# Patient Record
Sex: Female | Born: 1985 | Hispanic: No | Marital: Married | Smoking: Never smoker
Health system: Southern US, Community
[De-identification: ages and names within clinical notes are randomized; demographics above are authoritative.]

---

## 2018-04-21 ENCOUNTER — Encounter: Payer: Self-pay | Admitting: *Deleted

## 2018-05-15 ENCOUNTER — Emergency Department (HOSPITAL_COMMUNITY)
Admission: EM | Admit: 2018-05-15 | Discharge: 2018-05-15 | Disposition: A | Discharge status: Home / Self Care | Payer: Medicaid Other | Attending: Emergency Medicine | Admitting: Emergency Medicine

## 2018-05-15 ENCOUNTER — Encounter (HOSPITAL_COMMUNITY): Payer: Self-pay | Admitting: *Deleted

## 2018-05-15 ENCOUNTER — Emergency Department (HOSPITAL_COMMUNITY): Payer: Medicaid Other

## 2018-05-15 ENCOUNTER — Other Ambulatory Visit: Payer: Self-pay

## 2018-05-15 DIAGNOSIS — O039 Complete or unspecified spontaneous abortion without complication: Secondary | ICD-10-CM

## 2018-05-15 DIAGNOSIS — O034 Incomplete spontaneous abortion without complication: Secondary | ICD-10-CM | POA: Diagnosis not present

## 2018-05-15 DIAGNOSIS — Z79899 Other long term (current) drug therapy: Secondary | ICD-10-CM | POA: Insufficient documentation

## 2018-05-15 LAB — CBC WITH DIFFERENTIAL/PLATELET
Abs Immature Granulocytes: 0.1 10*3/uL (ref 0.0–0.1)
BASOS ABS: 0 10*3/uL (ref 0.0–0.1)
BASOS PCT: 0 %
EOS ABS: 0 10*3/uL (ref 0.0–0.7)
EOS PCT: 0 %
HCT: 40.6 % (ref 36.0–46.0)
HEMOGLOBIN: 12.9 g/dL (ref 12.0–15.0)
Immature Granulocytes: 1 %
LYMPHS PCT: 7 %
Lymphs Abs: 1 10*3/uL (ref 0.7–4.0)
MCH: 24.9 pg — ABNORMAL LOW (ref 26.0–34.0)
MCHC: 31.8 g/dL (ref 30.0–36.0)
MCV: 78.4 fL (ref 78.0–100.0)
Monocytes Absolute: 0.6 10*3/uL (ref 0.1–1.0)
Monocytes Relative: 5 %
Neutro Abs: 12.5 10*3/uL — ABNORMAL HIGH (ref 1.7–7.7)
Neutrophils Relative %: 87 %
PLATELETS: 315 10*3/uL (ref 150–400)
RBC: 5.18 MIL/uL — AB (ref 3.87–5.11)
RDW: 15.6 % — AB (ref 11.5–15.5)
WBC: 14.2 10*3/uL — AB (ref 4.0–10.5)

## 2018-05-15 LAB — I-STAT BETA HCG BLOOD, ED (MC, WL, AP ONLY): I-stat hCG, quantitative: >2000 m[IU]/mL — ABNORMAL HIGH (ref <5)

## 2018-05-15 LAB — ABO/RH: ABO/RH(D): A POS

## 2018-05-15 MED ORDER — IBUPROFEN 800 MG PO TABS: 800.0000 mg | ORAL_TABLET | Freq: Three times a day (TID) | ORAL | 0 refills | Status: AC

## 2018-05-15 NOTE — Discharge Instructions (Signed)
I am so sorry about this today. Please keep your appointment with the ob/gyn on Monday 05/18/18. I have prescribed you medication for the pain.

## 2018-05-15 NOTE — ED Notes (Signed)
Patient transported to Ultrasound 

## 2018-05-15 NOTE — ED Provider Notes (Signed)
[facility] EMERGENCY DEPARTMENT Provider Note  CSN: [id number] Arrival date & time: 05/15/18  4098  History   Chief Complaint Chief Complaint  Patient presents with  . Vaginal Bleeding   HPI  A medical interpreter was used for this interview.  Susan Wood is a 32 y.o. G2P1 female with no significant medical history who presented to the ED for vaginal bleeding x5 hours. She describes it as "just a little bit." Associated symptoms: abdominal cramping. Denies fever, chills, N/V, changes in bowel or urinary habits. Patient states she is ~ [redacted] weeks pregnant and has an upcoming appointment with an ob/gyn on Monday 05/19/18 at [facility].  History reviewed. No pertinent past medical history.  There are no active problems to display for this patient.  History reviewed. No pertinent surgical history.   OB History    Gravida  1   Para      Term      Preterm      AB      Living        SAB      TAB      Ectopic      Multiple      Live Births               Home Medications    Prior to Admission medications   Medication Sig Start Date End Date Taking? Authorizing Provider  Prenatal Vit-Fe Fumarate-FA (PRENATAL MULTIVITAMIN) TABS tablet Take 1 tablet by mouth daily at 12 noon.   Yes [provider]  ibuprofen (ADVIL,MOTRIN) 800 MG tablet Take 1 tablet (800 mg total) by mouth 3 (three) times daily for 10 days. 05/15/18 05/25/18  Mortis, Sharyon Medicus, PA-C    Family History History reviewed. No pertinent family history.  Social History Social History   Tobacco Use  . Smoking status: Never Smoker  . Smokeless tobacco: Never Used  Substance Use Topics  . Alcohol use: Never    Frequency: Never  . Drug use: Never     Allergies   Patient has no known allergies.   Review of Systems Review of Systems  Constitutional: Negative for chills, diaphoresis and fever.  Gastrointestinal: Positive for abdominal pain. Negative for  constipation, diarrhea, nausea and vomiting.  Genitourinary: Positive for vaginal bleeding. Negative for difficulty urinating, dysuria and frequency.  Musculoskeletal: Negative.   Skin: Negative.      Physical Exam Updated Vital Signs BP 106/68   Pulse 96   Temp 97.9 F (36.6 C) (Oral)   Resp 16   Ht 4\' 11"  (1.499 m)   Wt 43 kg (94 lb 12.8 oz)   LMP 03/13/2018 (Exact Date)   SpO2 100%   BMI 19.15 kg/m   Physical Exam  Constitutional: She appears well-developed and well-nourished. No distress.  Cardiovascular: Normal rate, regular rhythm and normal heart sounds.  Pulmonary/Chest: Effort normal and breath sounds normal.  Abdominal: Soft. Bowel sounds are normal. There is tenderness in the periumbilical area and suprapubic area. Hernia confirmed negative in the right inguinal area and confirmed negative in the left inguinal area.  Genitourinary: Pelvic exam was performed with patient prone. There is no rash or lesion on the right labia. There is no rash or lesion on the left labia. There is bleeding in the vagina.  Genitourinary Comments: Cervical os open. Dark red blood coming from cervix.  Skin: Skin is warm. Capillary refill takes less than 2 seconds. She is not diaphoretic. No pallor.  Nursing note and  vitals reviewed.    ED Treatments / Results  Labs (all labs ordered are listed, but only abnormal results are displayed) Labs Reviewed  CBC WITH DIFFERENTIAL/PLATELET - Abnormal; Notable for the following components:      Result Value   WBC 14.2 (*)    RBC 5.18 (*)    MCH 24.9 (*)    RDW 15.6 (*)    Neutro Abs 12.5 (*)    All other components within normal limits  I-STAT BETA HCG BLOOD, ED ([facility], [facility], [facility] ONLY) - Abnormal; Notable for the following components:   I-stat hCG, quantitative >2,000.0 (*)    All other components within normal limits  ABO/RH    EKG None  Radiology US Ob Comp Less 14 Wks  Result Date: 05/15/2018 CLINICAL DATA:  Vaginal bleeding. Estimated  gestational age of [redacted] weeks, 0 days by LMP. EXAM: OBSTETRIC <14 WK [place] AND TRANSVAGINAL OB US TECHNIQUE: Both transabdominal and transvaginal ultrasound examinations were performed for complete evaluation of the gestation as well as the maternal uterus, adnexal regions, and pelvic cul-de-sac. Transvaginal technique was performed to assess early pregnancy. COMPARISON:  None. FINDINGS: Intrauterine gestational sac: Poorly defined area of fluid along the endometrial canal. No discrete gestational sac identified. Yolk sac:  Not Visualized. Embryo:  Not Visualized. Subchorionic hemorrhage:  None visualized. Maternal uterus/adnexae: Unremarkable. IMPRESSION: 1. Poorly defined area of fluid along the endometrial canal without discrete gestational sac identified. No embryo. Given estimated gestational age of [redacted] weeks 0 days by LMP, findings are suspicious but not yet definitive for failed pregnancy. Recommend follow-up US in 10-14 days for definitive diagnosis. This recommendation follows SRU consensus guidelines: Diagnostic Criteria for Nonviable Pregnancy Early in the First Trimester. Malva Limes Med 2013; 161:0960-45. Electronically Signed   By: Obie Dredge M.D.   On: 05/15/2018 14:13   US Ob Transvaginal  Result Date: 05/15/2018 CLINICAL DATA:  Vaginal bleeding. Estimated gestational age of [redacted] weeks, 0 days by LMP. EXAM: OBSTETRIC <14 WK [place] AND TRANSVAGINAL OB US TECHNIQUE: Both transabdominal and transvaginal ultrasound examinations were performed for complete evaluation of the gestation as well as the maternal uterus, adnexal regions, and pelvic cul-de-sac. Transvaginal technique was performed to assess early pregnancy. COMPARISON:  None. FINDINGS: Intrauterine gestational sac: Poorly defined area of fluid along the endometrial canal. No discrete gestational sac identified. Yolk sac:  Not Visualized. Embryo:  Not Visualized. Subchorionic hemorrhage:  None visualized. Maternal uterus/adnexae: Unremarkable. IMPRESSION:  1. Poorly defined area of fluid along the endometrial canal without discrete gestational sac identified. No embryo. Given estimated gestational age of [redacted] weeks 0 days by LMP, findings are suspicious but not yet definitive for failed pregnancy. Recommend follow-up US in 10-14 days for definitive diagnosis. This recommendation follows SRU consensus guidelines: Diagnostic Criteria for Nonviable Pregnancy Early in the First Trimester. Malva Limes Med 2013; 409:8119-14. Electronically Signed   By: Obie Dredge M.D.   On: 05/15/2018 14:13    Procedures Procedures (including critical care time)  Medications Ordered in ED Medications - No data to display   Initial Impression / Assessment and Plan / ED Course  Triage vital signs and the nursing notes have been reviewed.  Pertinent labs & imaging results that were available during care of the patient were reviewed and considered in medical decision making (see chart for details).  Patient presents afebrile with symptoms concerning of a miscarriage. She is well appearing and has normal vital signs throughout the visit (with the exception of increased HR  during the pelvic exam). An open cervical os with bleeding was seen on pelvic exam. No POC visualized. Spontaneous abortion/miscarriage was discussed with the patient. After these findings were discussed with patient, she insists on getting pelvic ultrasound.  Clinical Course as of May 15 1606  Thu May 15, 2018  1130 Hcg recorded as > 2000. No previous hCG levels to compare to, but patient states she is ~ [redacted] weeks pregnant.   [GM]  1425 No gestational sac seen on U/S.   [GM]  1603 Rh +. No RhoGam needed.   [GM]    Clinical Course User Index [GM] Mortis, Sharyon MedicusGabrielle I, PA-C   Ultrasound results revealed and thorough education provided on miscarriages and appropriate follow-up measures. No s/s of an acute infection or sepsis that requires additional evaluation or antibiotics today.  Final Clinical  Impressions(s) / ED Diagnoses  1. Inevitable Abortion. Thorough education provided. Advised patient to keep ob/gyn follow-up that is scheduled for Monday 05/19/18. Ibuprofen prescribed for pain.  Dispo: Home. After thorough clinical evaluation, this patient is determined to be medically stable and can be safely discharged with the previously mentioned treatment and/or outpatient follow-up/referral(s). At this time, there are no other apparent medical conditions that require further screening, evaluation or treatment.   Final diagnoses:  Inevitable abortion    ED Discharge Orders        Ordered    ibuprofen (ADVIL,MOTRIN) 800 MG tablet  3 times daily     05/15/18 [address]1605        Mortis, Charlotte Court HouseGabrielle I, [place] JerseyPA-C 05/15/18 1608    Wynetta FinesMessick, Peter C, MD 05/16/18 [phone]

## 2018-05-15 NOTE — ED Triage Notes (Signed)
PT reports to be 9 weeks preg. And has vag bleeding that started at 0400 . Pt reports bleeding to be moderate. Pt also reports ABD pain 5/10

## 2018-05-19 ENCOUNTER — Ambulatory Visit (INDEPENDENT_AMBULATORY_CARE_PROVIDER_SITE_OTHER): Payer: Medicaid Other | Admitting: Obstetrics and Gynecology

## 2018-05-19 DIAGNOSIS — O200 Threatened abortion: Secondary | ICD-10-CM

## 2018-05-19 NOTE — Progress Notes (Signed)
Patient not seen by MD. Patient seen on 7/11 in ED with light vaginal bleeding and diagnosed with threatened miscarriage. Patient is currently without pain Quant HCG today with plans to repeat in 48 hours  Follow up ultrasound also ordered Patient will be contacted with results and further management plan

## 2018-05-20 LAB — BETA HCG QUANT (REF LAB): HCG QUANT: 1757 m[IU]/mL

## 2018-05-21 ENCOUNTER — Other Ambulatory Visit: Payer: Medicaid Other

## 2018-05-21 DIAGNOSIS — O200 Threatened abortion: Secondary | ICD-10-CM

## 2018-05-22 ENCOUNTER — Telehealth: Payer: Self-pay

## 2018-05-22 LAB — BETA HCG QUANT (REF LAB): hCG Quant: 823 m[IU]/mL

## 2018-05-22 NOTE — Telephone Encounter (Signed)
Pt aware of results 

## 2018-05-26 ENCOUNTER — Ambulatory Visit (INDEPENDENT_AMBULATORY_CARE_PROVIDER_SITE_OTHER): Payer: Medicaid Other | Admitting: Obstetrics and Gynecology

## 2018-05-26 ENCOUNTER — Ambulatory Visit (HOSPITAL_COMMUNITY)
Admission: RE | Admit: 2018-05-26 | Discharge: 2018-05-26 | Disposition: A | Discharge status: Home / Self Care | Payer: Medicaid Other | Source: Ambulatory Visit | Attending: Obstetrics and Gynecology | Admitting: Obstetrics and Gynecology

## 2018-05-26 ENCOUNTER — Encounter: Payer: Self-pay | Admitting: Obstetrics and Gynecology

## 2018-05-26 VITALS — BP 121/85 | HR 90 | Wt 92.0 lb

## 2018-05-26 DIAGNOSIS — O200 Threatened abortion: Secondary | ICD-10-CM | POA: Diagnosis present

## 2018-05-26 DIAGNOSIS — O039 Complete or unspecified spontaneous abortion without complication: Secondary | ICD-10-CM

## 2018-05-26 DIAGNOSIS — Z3A00 Weeks of gestation of pregnancy not specified: Secondary | ICD-10-CM | POA: Insufficient documentation

## 2018-05-26 MED ORDER — PREPLUS 27-1 MG PO TABS: 1.0000 | ORAL_TABLET | Freq: Every day | ORAL | 13 refills | Status: AC

## 2018-05-26 NOTE — Progress Notes (Signed)
Ultrasounds Results Note  SUBJECTIVE HPI:  Ms. Susan Wood is a 32 y.o. G1P0 who presents for followup ultrasound results. The patient denies abdominal pain. She reports light vaginal bleeding. She reports some dysuria Upon review of the patient's records, patient was first seen in ED on 7/11 for vaginal bleeding and diagnosed with a threatened miscarriage.   BHCG on that day was greater than 2000 (istat). Repeat quant HCG on 7/15 was 1757.  Ultrasound showed an irregularly shaped fluid collection in endometrial canal, no yolk sac or fetal pole visualized.  BHCG on 7/17 was 823.  Repeat ultrasound was performed earlier today.   History reviewed. No pertinent past medical history. History reviewed. No pertinent surgical history. Social History   Socioeconomic History  . Marital status: Married    Spouse name: Not on file  . Number of children: Not on file  . Years of education: Not on file  . Highest education level: Not on file  Occupational History  . Not on file  Social Needs  . Financial resource strain: Not on file  . Food insecurity:    Worry: Not on file    Inability: Not on file  . Transportation needs:    Medical: Not on file    Non-medical: Not on file  Tobacco Use  . Smoking status: Never Smoker  . Smokeless tobacco: Never Used  Substance and Sexual Activity  . Alcohol use: Never    Frequency: Never  . Drug use: Never  . Sexual activity: Not on file  Lifestyle  . Physical activity:    Days per week: Not on file    Minutes per session: Not on file  . Stress: Not on file  Relationships  . Social connections:    Talks on phone: Not on file    Gets together: Not on file    Attends religious service: Not on file    Active member of club or organization: Not on file    Attends meetings of clubs or organizations: Not on file    Relationship status: Not on file  . Intimate partner violence:    Fear of current or ex partner: Not on file    Emotionally abused: Not  on file    Physically abused: Not on file    Forced sexual activity: Not on file  Other Topics Concern  . Not on file  Social History Narrative  . Not on file   Current Outpatient Medications on File Prior to Visit  Medication Sig Dispense Refill  . Prenatal Vit-Fe Fumarate-FA (PRENATAL MULTIVITAMIN) TABS tablet Take 1 tablet by mouth daily at 12 noon.     No current facility-administered medications on file prior to visit.    No Known Allergies  I have reviewed patient's Past Medical Hx, Surgical Hx, Family Hx, Social Hx, medications and allergies.   Review of Systems Review of Systems  Constitutional: Negative for fever and chills.  Gastrointestinal: Negative for nausea, vomiting, abdominal pain, diarrhea and constipation.  Genitourinary: Negative for dysuria.  Musculoskeletal: Negative for back pain.  Neurological: Negative for dizziness and weakness.    Physical Exam  LMP 03/13/2018 (Exact Date)   GENERAL: Well-developed, well-nourished female in no acute distress.  HEENT: Normocephalic, atraumatic.   LUNGS: Effort normal ABDOMEN: soft, non-tender HEART: Regular rate  SKIN: Warm, dry and without erythema PSYCH: Normal mood and affect NEURO: Alert and oriented x 4  LAB RESULTS No results found for this or any previous visit (from the past 24 hour(s)).  IMAGING US Ob Comp Less 14 Wks  Result Date: 05/15/2018 CLINICAL DATA:  Vaginal bleeding. Estimated gestational age of [redacted] weeks, 0 days by LMP. EXAM: OBSTETRIC <14 WK [place] AND TRANSVAGINAL OB US TECHNIQUE: Both transabdominal and transvaginal ultrasound examinations were performed for complete evaluation of the gestation as well as the maternal uterus, adnexal regions, and pelvic cul-de-sac. Transvaginal technique was performed to assess early pregnancy. COMPARISON:  None. FINDINGS: Intrauterine gestational sac: Poorly defined area of fluid along the endometrial canal. No discrete gestational sac identified. Yolk sac:  Not  Visualized. Embryo:  Not Visualized. Subchorionic hemorrhage:  None visualized. Maternal uterus/adnexae: Unremarkable. IMPRESSION: 1. Poorly defined area of fluid along the endometrial canal without discrete gestational sac identified. No embryo. Given estimated gestational age of [redacted] weeks 0 days by LMP, findings are suspicious but not yet definitive for failed pregnancy. Recommend follow-up US in 10-14 days for definitive diagnosis. This recommendation follows SRU consensus guidelines: Diagnostic Criteria for Nonviable Pregnancy Early in the First Trimester. Malva Limes Med 2013; 132:4401-02. Electronically Signed   By: Obie Dredge M.D.   On: 05/15/2018 14:13   US Ob Transvaginal  Result Date: 05/15/2018 CLINICAL DATA:  Vaginal bleeding. Estimated gestational age of [redacted] weeks, 0 days by LMP. EXAM: OBSTETRIC <14 WK [place] AND TRANSVAGINAL OB US TECHNIQUE: Both transabdominal and transvaginal ultrasound examinations were performed for complete evaluation of the gestation as well as the maternal uterus, adnexal regions, and pelvic cul-de-sac. Transvaginal technique was performed to assess early pregnancy. COMPARISON:  None. FINDINGS: Intrauterine gestational sac: Poorly defined area of fluid along the endometrial canal. No discrete gestational sac identified. Yolk sac:  Not Visualized. Embryo:  Not Visualized. Subchorionic hemorrhage:  None visualized. Maternal uterus/adnexae: Unremarkable. IMPRESSION: 1. Poorly defined area of fluid along the endometrial canal without discrete gestational sac identified. No embryo. Given estimated gestational age of [redacted] weeks 0 days by LMP, findings are suspicious but not yet definitive for failed pregnancy. Recommend follow-up US in 10-14 days for definitive diagnosis. This recommendation follows SRU consensus guidelines: Diagnostic Criteria for Nonviable Pregnancy Early in the First Trimester. Malva Limes Med 2013; 725:3664-40. Electronically Signed   By: Obie Dredge M.D.   On:  05/15/2018 14:13   US Ob Less Than 14 Weeks With Ob Transvaginal  Result Date: 05/26/2018 CLINICAL DATA:  Evaluate for pregnancy. EXAM: OBSTETRIC <14 WK [place] AND TRANSVAGINAL OB US TECHNIQUE: Both transabdominal and transvaginal ultrasound examinations were performed for complete evaluation of the gestation as well as the maternal uterus, adnexal regions, and pelvic cul-de-sac. Transvaginal technique was performed to assess early pregnancy. COMPARISON:  Pelvic ultrasound 05/15/2018 FINDINGS: Intrauterine gestational sac: None Yolk sac:  Not Visualized. Embryo:  Not Visualized. Cardiac Activity: Not Visualized. Heart Rate: 0 bpm Maternal uterus/adnexae: No intrauterine gestation identified. Normal right and left ovary. No endometrial mass or fluid collection. No free fluid in the pelvis. IMPRESSION: No intrauterine gestation identified. In the setting of positive pregnancy test and no definite intrauterine pregnancy, this reflects a pregnancy of unknown location. Differential considerations include early normal IUP, abnormal IUP, or nonvisualized ectopic pregnancy. Differentiation is achieved with serial beta HCG supplemented by repeat sonography as clinically warranted. Electronically Signed   By: Annia Belt M.D.   On: 05/26/2018 10:55    ASSESSMENT 1. Spontaneous abortion in first trimester     PLAN Discharge home in stable condition Repeat quant HCG today Urine culture today Advised patient that levels need to be followed until negative Patient plans to  conceive again. Advised patient to wait 3 normal cycles Continue the use of prenatal vitamins  Deon Duer, MD  05/26/2018  2:56 PM

## 2018-05-26 NOTE — Progress Notes (Signed)
Pt states that she has a "pinching" like pain in abdomen.  Still having some bleeding, light to no bleeding.

## 2018-05-27 ENCOUNTER — Other Ambulatory Visit: Payer: Self-pay | Admitting: Obstetrics and Gynecology

## 2018-05-27 DIAGNOSIS — O200 Threatened abortion: Secondary | ICD-10-CM

## 2018-05-27 LAB — BETA HCG QUANT (REF LAB): hCG Quant: 167 m[IU]/mL

## 2018-05-28 LAB — URINE CULTURE

## 2018-06-02 ENCOUNTER — Other Ambulatory Visit: Payer: Medicaid Other

## 2018-06-02 DIAGNOSIS — O039 Complete or unspecified spontaneous abortion without complication: Secondary | ICD-10-CM

## 2018-06-03 LAB — BETA HCG QUANT (REF LAB): hCG Quant: 23 m[IU]/mL

## 2018-06-09 ENCOUNTER — Other Ambulatory Visit: Payer: Medicaid Other

## 2018-06-09 DIAGNOSIS — O039 Complete or unspecified spontaneous abortion without complication: Secondary | ICD-10-CM

## 2018-06-10 LAB — HCG, SERUM, QUALITATIVE: HCG, BETA SUBUNIT, QUAL, SERUM: NEGATIVE m[IU]/mL (ref <6)

## 2018-08-26 ENCOUNTER — Ambulatory Visit (HOSPITAL_COMMUNITY)
Admission: RE | Admit: 2018-08-26 | Discharge: 2018-08-26 | Disposition: A | Discharge status: Home / Self Care | Payer: Medicaid Other | Source: Ambulatory Visit | Attending: Obstetrics and Gynecology | Admitting: Obstetrics and Gynecology

## 2018-08-26 ENCOUNTER — Ambulatory Visit (INDEPENDENT_AMBULATORY_CARE_PROVIDER_SITE_OTHER): Payer: Medicaid Other | Admitting: Obstetrics and Gynecology

## 2018-08-26 ENCOUNTER — Encounter: Payer: Self-pay | Admitting: Obstetrics and Gynecology

## 2018-08-26 VITALS — BP 115/78 | HR 101 | Wt 96.4 lb

## 2018-08-26 DIAGNOSIS — N939 Abnormal uterine and vaginal bleeding, unspecified: Secondary | ICD-10-CM

## 2018-08-26 LAB — POCT URINE PREGNANCY: Preg Test, Ur: NEGATIVE

## 2018-08-26 NOTE — Progress Notes (Signed)
Pt is here for abnormal menstrual cycles. Pt had a miscarriage in 7/19. HCG was followed to 0. Pt states that 09/30 had menstrual bleeding and then 10/12 again had a cycle. Pt states she is concerned about the irregular bleeding.

## 2018-08-26 NOTE — Progress Notes (Signed)
32 yo G2P1010 here for evaluation of irregular menses. Patient had a miscarriage in July and reports a regular cycle since until most recently in October. She describes a monthly 28 days cycle with vaginal bleeding for 3-4 days. However, in October, she experienced a period 2 weeks after her September period which lasted more than 4 days. The bleeding is now reduced to vaginal spotting. Patient also reports some occasional lower abdominal pelvic pain. She is without any other concerns. She denies any abnormal vaginal bleeding or discharge. Patient denies any external stressors, excessive exercise or change in her diet.  No past medical history on file. Past Surgical History:  Procedure Laterality Date  . CESAREAN SECTION     No family history on file. Social History   Tobacco Use  . Smoking status: Never Smoker  . Smokeless tobacco: Never Used  Substance Use Topics  . Alcohol use: Never    Frequency: Never  . Drug use: Never   ROS See pertinent in HPI  Blood pressure 115/78, pulse (!) 101, weight 96 lb 6.4 oz (43.7 kg), last menstrual period 08/16/2018. GENERAL: Well-developed, well-nourished female in no acute distress.  ABDOMEN: Soft, nontender, nondistended. No organomegaly. PELVIC: Normal external female genitalia. Vagina is pink and rugated.  Brown discharge. Normal appearing cervix. Uterus is normal in size. No adnexal mass or tenderness. EXTREMITIES: No cyanosis, clubbing, or edema, 2+ distal pulses.  A/P 32 yo with irregular cycle x 1 month  - Reassurance provided  - Advised to keep a menstrual calendar and to return if irregular bleeding pattern persists - Pelvic ultrasound ordered - Patient desires to conceive- advised to continue prenatal vitamins - Patient will be contacted with results of ultrasound and further management if indicated - RTC prn

## 2018-08-27 ENCOUNTER — Telehealth: Payer: Self-pay

## 2018-08-27 NOTE — Telephone Encounter (Signed)
Contacted pt via interpreter and advised of results.

## 2019-03-02 IMAGING — US US OB COMP LESS 14 WK
1 series · 15 of 28 positions shown · non-contrast
Comparison: Pelvic ultrasound 05/15/2018

ADDENDUM:
Addendum for correction of

EXAM:
No transvaginal ultrasound was performed during the exam. This was
inadvertently dictated during reading of the exam. Only
transabdominal images were obtained.
CLINICAL DATA: Evaluate for pregnancy.
OBSTETRIC <14 WK US AND TRANSVAGINAL OB US
TECHNIQUE: Both transabdominal and transvaginal ultrasound examinations were
performed for complete evaluation of the gestation as well as the
maternal uterus, adnexal regions, and pelvic cul-de-sac.
Transvaginal technique was performed to assess early pregnancy.

[Series 1: us ob comp less 14 wk · 36 acquisitions, 15 frames shown]
[im 1/36]
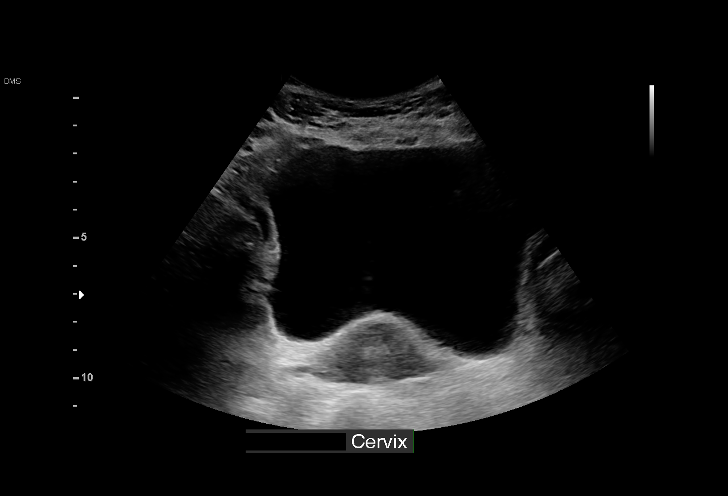
[im 3/36]
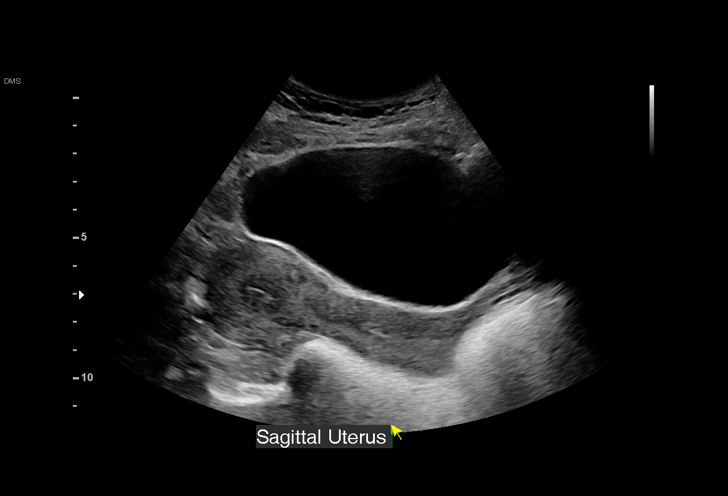
[im 6/36]
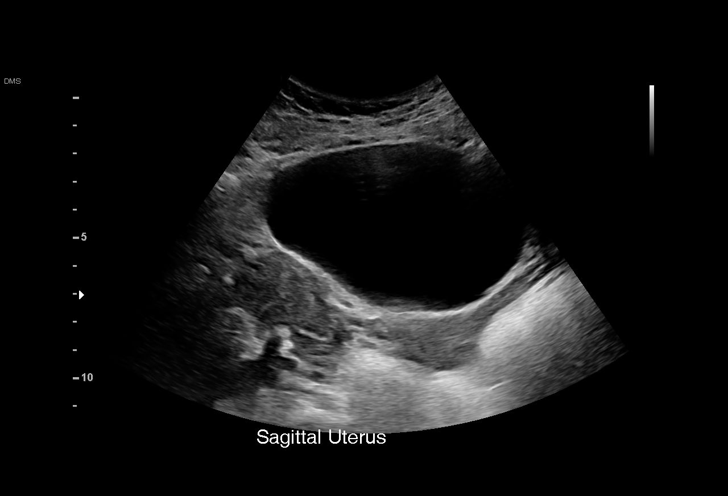
[im 8/36]
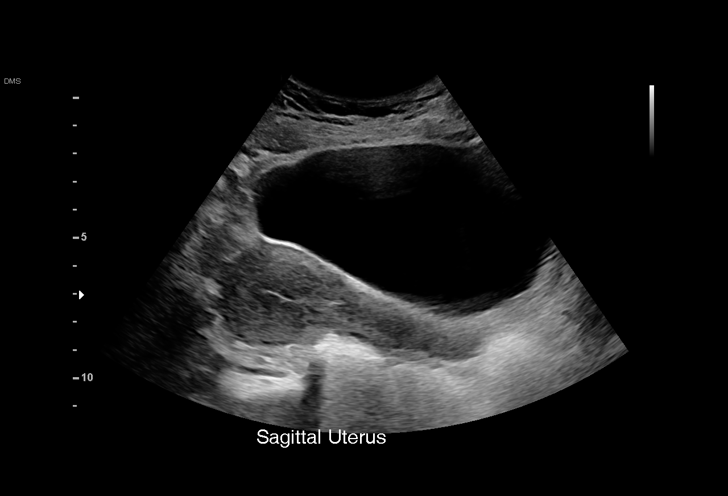
[im 11/36]
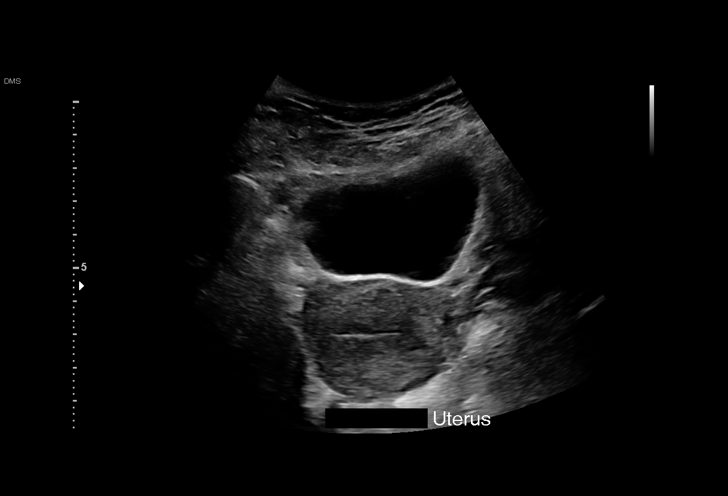
[im 13/36]
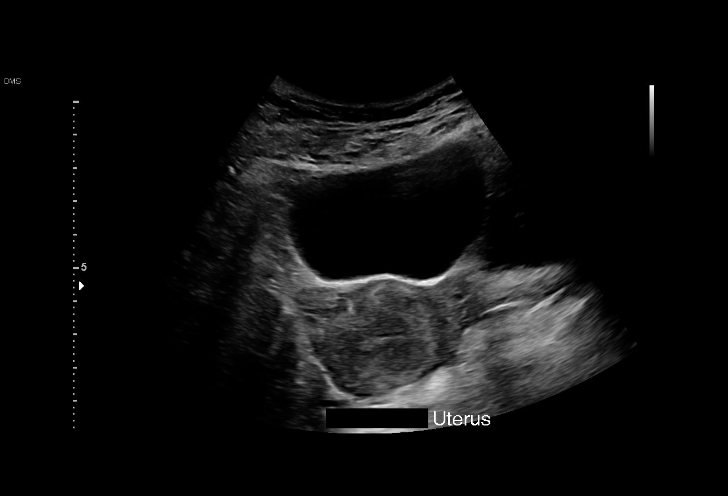
[im 16/36]
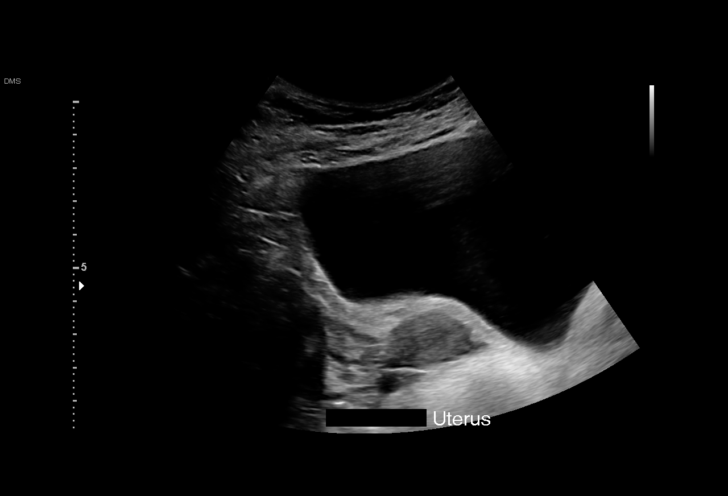
[im 19/36]
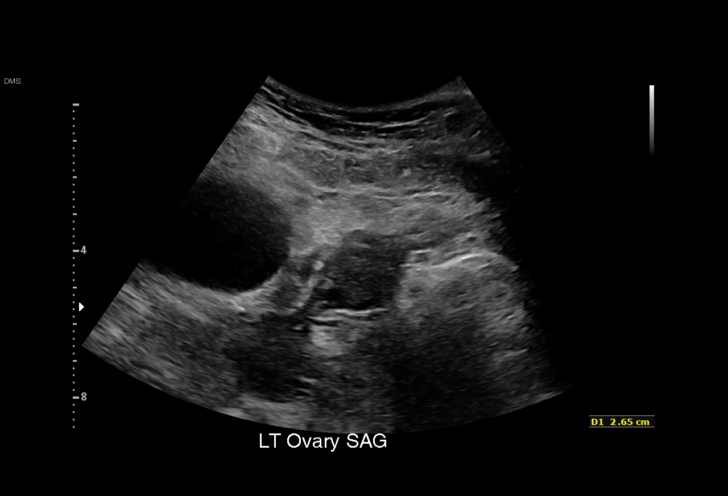
[im 20/36]
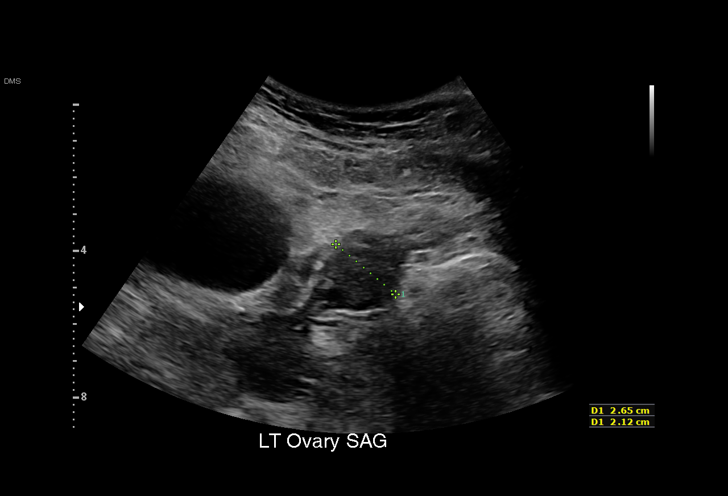
[im 23/36]
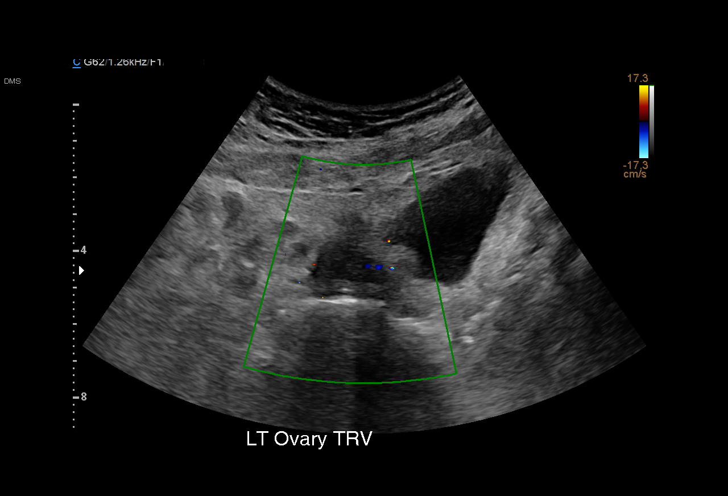
[im 25/36]
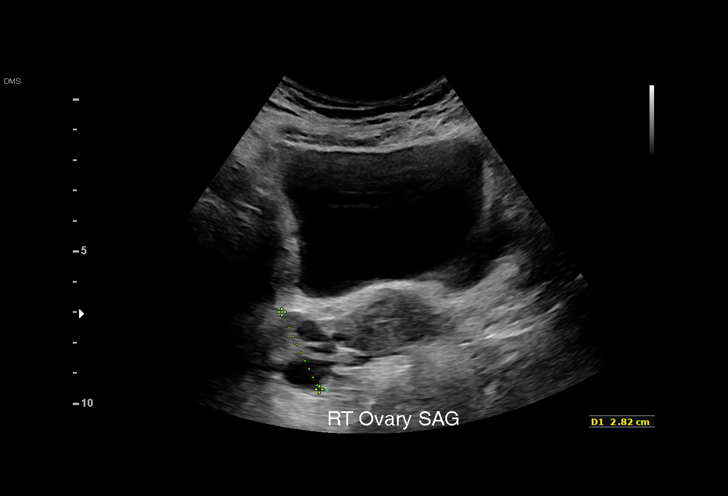
[im 28/36]
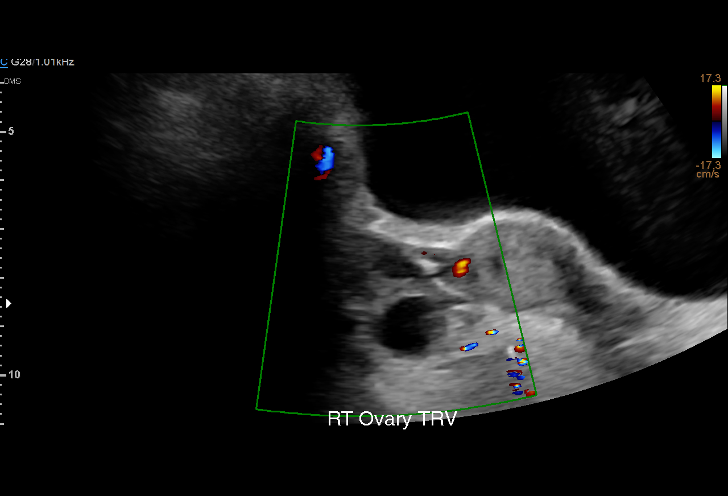
[im 30/36]
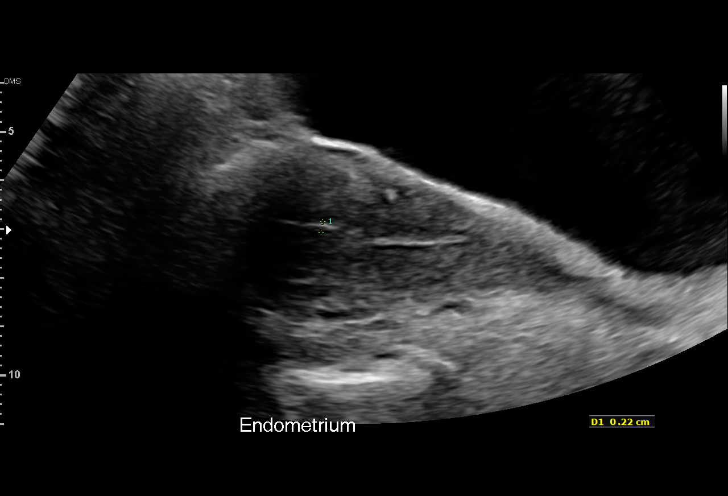
[im 33/36]
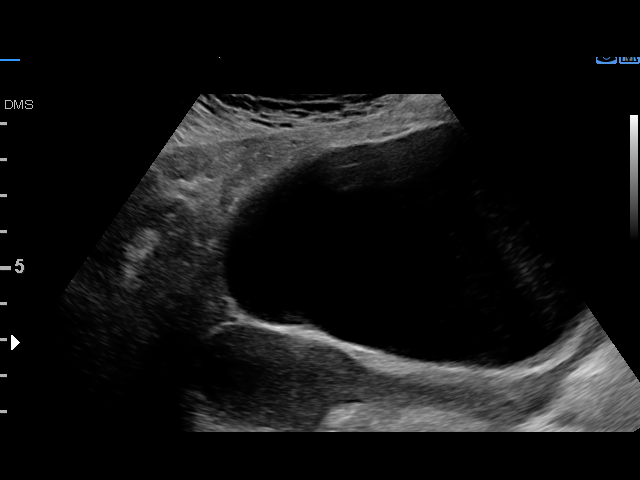
[im 36/36]
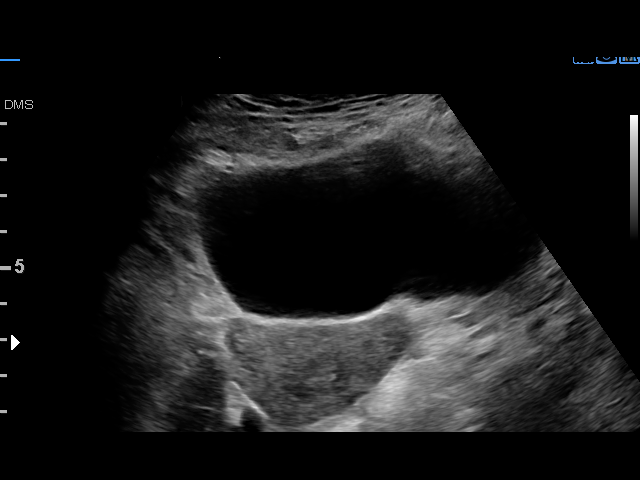

[15 of 28 positions shown; findings below may reference images not displayed]

FINDINGS: Intrauterine gestational sac: None

Yolk sac:  Not Visualized.

Embryo:  Not Visualized.

Cardiac Activity: Not Visualized.

Heart Rate: 0 bpm

Maternal uterus/adnexae: No intrauterine gestation identified.
Normal right and left ovary. No endometrial mass or fluid
collection. No free fluid in the pelvis.
IMPRESSION: No intrauterine gestation identified. In the setting of positive
pregnancy test and no definite intrauterine pregnancy, this reflects
a pregnancy of unknown location. Differential considerations include
early normal IUP, abnormal IUP, or nonvisualized ectopic pregnancy.
Differentiation is achieved with serial beta HCG supplemented by
repeat sonography as clinically warranted.
